# Patient Record
Sex: Male | Born: 1955 | Race: White | Hispanic: No | Marital: Married | Smoking: Never smoker
Health system: Southern US, Community
[De-identification: ages and names within clinical notes are randomized; demographics above are authoritative.]

## PROBLEM LIST (undated history)

## (undated) DIAGNOSIS — E785 Hyperlipidemia, unspecified: Secondary | ICD-10-CM

## (undated) DIAGNOSIS — I252 Old myocardial infarction: Secondary | ICD-10-CM

## (undated) HISTORY — PX: CORONARY ANGIOPLASTY WITH STENT PLACEMENT: SHX49

## (undated) HISTORY — DX: Hyperlipidemia, unspecified: E78.5

---

## 2003-09-03 ENCOUNTER — Ambulatory Visit (HOSPITAL_COMMUNITY): Admission: RE | Admit: 2003-09-03 | Discharge: 2003-09-03 | Payer: Self-pay | Admitting: Family Medicine

## 2004-11-09 ENCOUNTER — Emergency Department (HOSPITAL_COMMUNITY): Admission: EM | Admit: 2004-11-09 | Discharge: 2004-11-09 | Payer: Self-pay | Admitting: Emergency Medicine

## 2012-08-23 ENCOUNTER — Ambulatory Visit
Admission: RE | Admit: 2012-08-23 | Discharge: 2012-08-23 | Disposition: A | Discharge status: Home / Self Care | Payer: BC Managed Care – PPO | Source: Ambulatory Visit | Attending: Family Medicine | Admitting: Family Medicine

## 2012-08-23 ENCOUNTER — Other Ambulatory Visit: Payer: Self-pay | Admitting: Family Medicine

## 2012-08-23 DIAGNOSIS — R0602 Shortness of breath: Secondary | ICD-10-CM

## 2013-07-11 ENCOUNTER — Ambulatory Visit (INDEPENDENT_AMBULATORY_CARE_PROVIDER_SITE_OTHER): Payer: BC Managed Care – PPO | Admitting: Family Medicine

## 2013-07-11 ENCOUNTER — Encounter (INDEPENDENT_AMBULATORY_CARE_PROVIDER_SITE_OTHER): Payer: Self-pay

## 2013-07-11 ENCOUNTER — Encounter: Payer: Self-pay | Admitting: Family Medicine

## 2013-07-11 VITALS — BP 135/84 | HR 57 | Ht 71.0 in | Wt 190.0 lb

## 2013-07-11 DIAGNOSIS — M25569 Pain in unspecified knee: Secondary | ICD-10-CM

## 2013-07-11 DIAGNOSIS — M25561 Pain in right knee: Secondary | ICD-10-CM

## 2013-07-11 NOTE — Patient Instructions (Signed)
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 Start physical therapy for only 1-3 visits total to learn home exercise program and do this daily. Continue with mobic 15mg  daily with food for next 6 weeks. Icing as needed. Knee brace if this feels more supportive. Elevate above the level of your heart as needed for swelling. Follow up with me in 6 weeks for reevaluation. Consider injection, MRI if not improving as expected.

## 2013-07-15 ENCOUNTER — Encounter: Payer: Self-pay | Admitting: Family Medicine

## 2013-07-15 DIAGNOSIS — M25561 Pain in right knee: Secondary | ICD-10-CM | POA: Insufficient documentation

## 2013-07-15 NOTE — Progress Notes (Signed)
Patient ID: Jeremy Madden, male   DOB: August 20, 1955, 58 y.o.   MRN: [id number]  PCP: No primary provider on file.  Subjective:   HPI: Patient is a 58 y.o. male here for right knee pain.  Patient reports before thanksgiving he started getting pain in right knee. Works out about 5-6 days a week. No acute injury. Is also a PE teacher. Has tried aleve, icing, heat. No catching, locking, giving out. Seen MD at [facility] and scheduled for an MRI to assess for a meniscus tear. PCP gave him meloxicam instead of aleve.  Past Medical History  Diagnosis Date  . Hyperlipidemia     No current outpatient prescriptions on file prior to visit.   No current facility-administered medications on file prior to visit.    History reviewed. No pertinent past surgical history.  No Known Allergies  History   Social History  . Marital Status: Married    Spouse Name: N/A    Number of Children: N/A  . Years of Education: N/A   Occupational History  . Not on file.   Social History Main Topics  . Smoking status: Never Smoker   . Smokeless tobacco: Not on file  . Alcohol Use: Not on file  . Drug Use: Not on file  . Sexual Activity: Not on file   Other Topics Concern  . Not on file   Social History Narrative  . No narrative on file    Family History  Problem Relation Age of Onset  . Diabetes Mother   . Hypertension Mother   . Heart attack Father   . Sudden death Father     BP 135/84  Pulse 57  Ht 5\' 11"  (1.803 m)  Wt 190 lb (86.183 kg)  BMI 26.51 kg/m2  Review of Systems: See HPI above.    Objective:  Physical Exam:  Gen: NAD  Right knee: Mild effusion.  No gross deformity, ecchymoses.  TTP medial joint line. FROM. Negative ant/post drawers. Negative valgus/varus testing. Negative lachmanns. Positive mcmurrays, apleys, patellar apprehension. NV intact distally.    Assessment & Plan:  1. Right knee pain - radiographs reportedly negative for DJD - done at  [facility].  Advised he start with conservative treatment for degenerative medial meniscal tear.  PT, home exercises, mobic, icing, consider knee brace.  Consider injection, MRI if not improving.

## 2013-07-15 NOTE — Assessment & Plan Note (Signed)
radiographs reportedly negative for DJD - done at [facility]. Advised he start with conservative treatment for degenerative medial meniscal tear. PT, home exercises, mobic, icing, consider knee brace. Consider injection, MRI if not improving.

## 2013-08-03 ENCOUNTER — Ambulatory Visit (INDEPENDENT_AMBULATORY_CARE_PROVIDER_SITE_OTHER): Payer: BC Managed Care – PPO | Admitting: Physical Therapy

## 2013-08-03 DIAGNOSIS — M25669 Stiffness of unspecified knee, not elsewhere classified: Secondary | ICD-10-CM

## 2013-08-03 DIAGNOSIS — M25569 Pain in unspecified knee: Secondary | ICD-10-CM

## 2013-08-03 DIAGNOSIS — M6281 Muscle weakness (generalized): Secondary | ICD-10-CM

## 2013-08-03 DIAGNOSIS — R609 Edema, unspecified: Secondary | ICD-10-CM

## 2013-08-10 ENCOUNTER — Encounter (INDEPENDENT_AMBULATORY_CARE_PROVIDER_SITE_OTHER): Payer: BC Managed Care – PPO | Admitting: Physical Therapy

## 2013-08-10 DIAGNOSIS — M6281 Muscle weakness (generalized): Secondary | ICD-10-CM

## 2013-08-10 DIAGNOSIS — M25669 Stiffness of unspecified knee, not elsewhere classified: Secondary | ICD-10-CM

## 2013-08-10 DIAGNOSIS — R609 Edema, unspecified: Secondary | ICD-10-CM

## 2013-08-10 DIAGNOSIS — M25569 Pain in unspecified knee: Secondary | ICD-10-CM

## 2013-08-17 ENCOUNTER — Encounter: Payer: BC Managed Care – PPO | Admitting: Physical Therapy

## 2013-08-20 ENCOUNTER — Ambulatory Visit (INDEPENDENT_AMBULATORY_CARE_PROVIDER_SITE_OTHER): Payer: BC Managed Care – PPO | Admitting: Family Medicine

## 2013-08-20 ENCOUNTER — Encounter: Payer: Self-pay | Admitting: Family Medicine

## 2013-08-20 VITALS — BP 154/74 | HR 71 | Ht 71.0 in | Wt 190.0 lb

## 2013-08-20 DIAGNOSIS — M25561 Pain in right knee: Secondary | ICD-10-CM

## 2013-08-20 DIAGNOSIS — M25569 Pain in unspecified knee: Secondary | ICD-10-CM

## 2013-08-20 NOTE — Patient Instructions (Signed)
Stop the physical therapy. We will go ahead with an MRI. I typically will call you the afternoon following this to go over results and next steps.

## 2013-08-21 ENCOUNTER — Ambulatory Visit (INDEPENDENT_AMBULATORY_CARE_PROVIDER_SITE_OTHER): Payer: BC Managed Care – PPO

## 2013-08-21 ENCOUNTER — Encounter: Payer: Self-pay | Admitting: Family Medicine

## 2013-08-21 DIAGNOSIS — M25469 Effusion, unspecified knee: Secondary | ICD-10-CM

## 2013-08-21 DIAGNOSIS — X58XXXA Exposure to other specified factors, initial encounter: Secondary | ICD-10-CM

## 2013-08-21 DIAGNOSIS — M25561 Pain in right knee: Secondary | ICD-10-CM

## 2013-08-21 DIAGNOSIS — IMO0002 Reserved for concepts with insufficient information to code with codable children: Secondary | ICD-10-CM

## 2013-08-21 NOTE — Progress Notes (Addendum)
Patient ID: Jeremy Madden, male   DOB: 1956/03/27, 58 y.o.   MRN: [id number]  PCP: No primary provider on file.  Subjective:   HPI: Patient is a 58 y.o. male here for right knee pain.  1/28: Patient reports before thanksgiving he started getting pain in right knee. Works out about 5-6 days a week. No acute injury. Is also a PE teacher. Has tried aleve, icing, heat. No catching, locking, giving out. Seen MD at [facility] and scheduled for an MRI to assess for a meniscus tear. PCP gave him meloxicam instead of aleve.  3/9: Patient reports unfortunately his right knee feels worse. Has been doing physical therapy and home exercises. Taking mobic and using a brace. Had to be out of work Wednesday due to pain. Still swelling. Using heat, ice. No catching, locking, giving out.  Past Medical History  Diagnosis Date  . Hyperlipidemia     Current Outpatient Prescriptions on File Prior to Visit  Medication Sig Dispense Refill  . atorvastatin (LIPITOR) 80 MG tablet       . lisinopril (PRINIVIL,ZESTRIL) 2.5 MG tablet       . meloxicam (MOBIC) 15 MG tablet       . ZETIA 10 MG tablet        No current facility-administered medications on file prior to visit.    History reviewed. No pertinent past surgical history.  No Known Allergies  History   Social History  . Marital Status: Married    Spouse Name: N/A    Number of Children: N/A  . Years of Education: N/A   Occupational History  . Not on file.   Social History Main Topics  . Smoking status: Never Smoker   . Smokeless tobacco: Not on file  . Alcohol Use: Not on file  . Drug Use: Not on file  . Sexual Activity: Not on file   Other Topics Concern  . Not on file   Social History Narrative  . No narrative on file    Family History  Problem Relation Age of Onset  . Diabetes Mother   . Hypertension Mother   . Heart attack Father   . Sudden death Father     BP 154/74  Pulse 71  Ht 5\' 11"  (1.803 m)   Wt 190 lb (86.183 kg)  BMI 26.51 kg/m2  Review of Systems: See HPI above.    Objective:  Physical Exam:  Gen: NAD  Right knee: Mild effusion.  No gross deformity, ecchymoses.  TTP medial joint line. FROM. Negative ant/post drawers. Negative valgus/varus testing. Negative lachmanns. Positive mcmurrays, apleys.  Negative patellar apprehension. NV intact distally.    Assessment & Plan:  1. Right knee pain - radiographs reportedly negative for DJD - done at [facility].  Not improving with physical therapy, mobic.  Will stop PT and go ahead with MRI to assess for medial meniscal tear.  Consider injection, arthroscopy depending on those results.  Addendum:  MRI reviewed and discussed with patient.  He has a medial meniscal tear and moderate effusion.  In a lot of pain currently.  Will go ahead with referral back to ortho to discuss arthroscopic debridement.

## 2013-08-21 NOTE — Assessment & Plan Note (Signed)
radiographs reportedly negative for DJD - done at [facility] [place].  Not improving with physical therapy, mobic.  Will stop PT and go ahead with MRI to assess for medial meniscal tear.  Consider injection, arthroscopy depending on those results.

## 2013-08-22 ENCOUNTER — Ambulatory Visit: Payer: BC Managed Care – PPO | Admitting: Family Medicine

## 2019-01-29 ENCOUNTER — Emergency Department (HOSPITAL_BASED_OUTPATIENT_CLINIC_OR_DEPARTMENT_OTHER): Payer: BC Managed Care – PPO

## 2019-01-29 ENCOUNTER — Observation Stay (HOSPITAL_BASED_OUTPATIENT_CLINIC_OR_DEPARTMENT_OTHER)
Admission: EM | Admit: 2019-01-29 | Discharge: 2019-01-30 | Disposition: A | Discharge status: Home / Self Care | Payer: BC Managed Care – PPO | Attending: Internal Medicine | Admitting: Internal Medicine

## 2019-01-29 ENCOUNTER — Encounter (HOSPITAL_BASED_OUTPATIENT_CLINIC_OR_DEPARTMENT_OTHER): Payer: Self-pay | Admitting: *Deleted

## 2019-01-29 ENCOUNTER — Other Ambulatory Visit: Payer: Self-pay

## 2019-01-29 DIAGNOSIS — Z20828 Contact with and (suspected) exposure to other viral communicable diseases: Secondary | ICD-10-CM | POA: Diagnosis not present

## 2019-01-29 DIAGNOSIS — M1990 Unspecified osteoarthritis, unspecified site: Secondary | ICD-10-CM | POA: Diagnosis not present

## 2019-01-29 DIAGNOSIS — E785 Hyperlipidemia, unspecified: Secondary | ICD-10-CM | POA: Insufficient documentation

## 2019-01-29 DIAGNOSIS — R001 Bradycardia, unspecified: Secondary | ICD-10-CM | POA: Diagnosis not present

## 2019-01-29 DIAGNOSIS — I10 Essential (primary) hypertension: Secondary | ICD-10-CM | POA: Diagnosis not present

## 2019-01-29 DIAGNOSIS — Z955 Presence of coronary angioplasty implant and graft: Secondary | ICD-10-CM | POA: Diagnosis not present

## 2019-01-29 DIAGNOSIS — Z79899 Other long term (current) drug therapy: Secondary | ICD-10-CM | POA: Diagnosis not present

## 2019-01-29 DIAGNOSIS — I252 Old myocardial infarction: Secondary | ICD-10-CM | POA: Diagnosis not present

## 2019-01-29 DIAGNOSIS — R519 Headache, unspecified: Secondary | ICD-10-CM | POA: Diagnosis present

## 2019-01-29 DIAGNOSIS — R42 Dizziness and giddiness: Principal | ICD-10-CM | POA: Insufficient documentation

## 2019-01-29 DIAGNOSIS — R51 Headache: Secondary | ICD-10-CM | POA: Diagnosis not present

## 2019-01-29 HISTORY — DX: Old myocardial infarction: I25.2

## 2019-01-29 LAB — COMPREHENSIVE METABOLIC PANEL
ALT: 29 U/L (ref 0–44)
AST: 26 U/L (ref 15–41)
Albumin: 3.5 g/dL (ref 3.5–5.0)
Alkaline Phosphatase: 39 U/L (ref 38–126)
Anion gap: 9 (ref 5–15)
BUN: 15 mg/dL (ref 8–23)
CO2: 25 mmol/L (ref 22–32)
Calcium: 8.5 mg/dL — ABNORMAL LOW (ref 8.9–10.3)
Chloride: 106 mmol/L (ref 98–111)
Creatinine, Ser: 0.97 mg/dL (ref 0.61–1.24)
GFR calc Af Amer: >60 mL/min (ref >60)
GFR calc non Af Amer: >60 mL/min (ref >60)
Glucose, Bld: 120 mg/dL — ABNORMAL HIGH (ref 70–99)
Potassium: 3.6 mmol/L (ref 3.5–5.1)
Sodium: 140 mmol/L (ref 135–145)
Total Bilirubin: 0.7 mg/dL (ref 0.3–1.2)
Total Protein: 6.6 g/dL (ref 6.5–8.1)

## 2019-01-29 LAB — URINALYSIS, ROUTINE W REFLEX MICROSCOPIC
Bilirubin Urine: NEGATIVE
Glucose, UA: NEGATIVE mg/dL
Ketones, ur: NEGATIVE mg/dL
Leukocytes,Ua: NEGATIVE
Nitrite: NEGATIVE
Protein, ur: NEGATIVE mg/dL
Specific Gravity, Urine: >1.03 — ABNORMAL HIGH (ref 1.005–1.030)
pH: 6 (ref 5.0–8.0)

## 2019-01-29 LAB — URINALYSIS, MICROSCOPIC (REFLEX): Bacteria, UA: NONE SEEN

## 2019-01-29 LAB — CBC WITH DIFFERENTIAL/PLATELET
Abs Immature Granulocytes: 0.03 10*3/uL (ref 0.00–0.07)
Basophils Absolute: 0 10*3/uL (ref 0.0–0.1)
Basophils Relative: 0 %
Eosinophils Absolute: 0.1 10*3/uL (ref 0.0–0.5)
Eosinophils Relative: 2 %
HCT: 42.6 % (ref 39.0–52.0)
Hemoglobin: 14.3 g/dL (ref 13.0–17.0)
Immature Granulocytes: 1 %
Lymphocytes Relative: 21 %
Lymphs Abs: 1.2 10*3/uL (ref 0.7–4.0)
MCH: 29.4 pg (ref 26.0–34.0)
MCHC: 33.6 g/dL (ref 30.0–36.0)
MCV: 87.5 fL (ref 80.0–100.0)
Monocytes Absolute: 0.8 10*3/uL (ref 0.1–1.0)
Monocytes Relative: 14 %
Neutro Abs: 3.6 10*3/uL (ref 1.7–7.7)
Neutrophils Relative %: 62 %
Platelets: 187 10*3/uL (ref 150–400)
RBC: 4.87 MIL/uL (ref 4.22–5.81)
RDW: 11.9 % (ref 11.5–15.5)
WBC: 5.8 10*3/uL (ref 4.0–10.5)
nRBC: 0 % (ref 0.0–0.2)

## 2019-01-29 LAB — APTT: aPTT: 27 seconds (ref 24–36)

## 2019-01-29 LAB — RAPID URINE DRUG SCREEN, HOSP PERFORMED
Amphetamines: NOT DETECTED
Barbiturates: NOT DETECTED
Benzodiazepines: NOT DETECTED
Cocaine: NOT DETECTED
Opiates: NOT DETECTED
Tetrahydrocannabinol: NOT DETECTED

## 2019-01-29 LAB — PROTIME-INR
INR: 1 (ref 0.8–1.2)
Prothrombin Time: 13.2 seconds (ref 11.4–15.2)

## 2019-01-29 LAB — ETHANOL: Alcohol, Ethyl (B): <10 mg/dL (ref <10)

## 2019-01-29 MED ORDER — IOHEXOL 350 MG/ML SOLN
100.0000 mL | Freq: Once | INTRAVENOUS | Status: AC | PRN
  Administered 2019-01-29: 100 mL via INTRAVENOUS

## 2019-01-29 NOTE — ED Notes (Signed)
Patient transported to CT 

## 2019-01-29 NOTE — ED Triage Notes (Addendum)
Pt c/o sudden h/a lasting 2 mins at 67, c/o dizziness since , pt states " my eyes are disoriented"

## 2019-01-29 NOTE — ED Provider Notes (Signed)
[facility] EMERGENCY DEPARTMENT Provider Note   CSN: [id number] Arrival date & time: 01/29/19  1946     History   Chief Complaint Chief Complaint  Patient presents with   Dizziness    HPI Jeremy Madden is a 63 y.o. male.     The history is provided by the patient and medical records. No language interpreter was used.  Neurologic Problem This is a new problem. The current episode started 6 to 12 hours ago. The problem has been resolved. Associated symptoms include headaches. Pertinent negatives include no chest pain, no abdominal pain and no shortness of breath. Nothing aggravates the symptoms. Nothing relieves the symptoms. He has tried nothing for the symptoms. The treatment provided no relief.    Past Medical History:  Diagnosis Date   Hyperlipidemia    MI, old     Patient Active Problem List   Diagnosis Date Noted   Right knee pain 07/15/2013    History reviewed. No pertinent surgical history.      Home Medications    Prior to Admission medications   Medication Sig Start Date End Date Taking? Authorizing Provider  atorvastatin (LIPITOR) 80 MG tablet  05/15/13   [provider]  lisinopril (PRINIVIL,ZESTRIL) 2.5 MG tablet  06/19/13   [provider]  meloxicam (MOBIC) 15 MG tablet  07/03/13   [provider]  ZETIA 10 MG tablet  06/20/13   [provider]    Family History Family History  Problem Relation Age of Onset   Diabetes Mother    Hypertension Mother    Heart attack Father    Sudden death Father     Social History Social History   Tobacco Use   Smoking status: Never Smoker   Smokeless tobacco: Never Used  Substance Use Topics   Alcohol use: Not Currently   Drug use: Not on file     Allergies   Patient has no known allergies.   Review of Systems Review of Systems  Constitutional: Negative for chills, diaphoresis, fatigue and fever.  HENT: Negative for congestion.   Eyes:  Positive for visual disturbance. Negative for photophobia.  Respiratory: Negative for cough, chest tightness and shortness of breath.   Cardiovascular: Negative for chest pain.  Gastrointestinal: Negative for abdominal pain, constipation, diarrhea, nausea, rectal pain and vomiting.  Genitourinary: Negative for dysuria.  Musculoskeletal: Negative for back pain, neck pain and neck stiffness.  Skin: Negative for rash and wound.  Neurological: Positive for dizziness and headaches. Negative for weakness and light-headedness.  Psychiatric/Behavioral: Negative for agitation and confusion.  All other systems reviewed and are negative.    Physical Exam Updated Vital Signs Pulse 65    Temp 97.8 F (36.6 C) (Oral)    Resp 18    Ht 6' (1.829 m)    Wt 88.5 kg    SpO2 100%    BMI 26.45 kg/m   Physical Exam Vitals signs and nursing note reviewed.  Constitutional:      General: He is not in acute distress.    Appearance: Normal appearance. He is well-developed. He is not ill-appearing, toxic-appearing or diaphoretic.  HENT:     Head: Normocephalic and atraumatic.     Nose: No congestion or rhinorrhea.     Mouth/Throat:     Mouth: Mucous membranes are moist.     Pharynx: No oropharyngeal exudate.  Eyes:     General: No visual field deficit.    Extraocular Movements: Extraocular movements intact.  Conjunctiva/sclera: Conjunctivae normal.     Pupils: Pupils are equal, round, and reactive to light.  Neck:     Musculoskeletal: Neck supple. No neck rigidity or muscular tenderness.  Cardiovascular:     Rate and Rhythm: Regular rhythm. Bradycardia present.     Heart sounds: No murmur.  Pulmonary:     Effort: Pulmonary effort is normal. No respiratory distress.     Breath sounds: Normal breath sounds.  Abdominal:     General: There is no distension.     Palpations: Abdomen is soft.     Tenderness: There is no abdominal tenderness.  Skin:    General: Skin is warm and dry.     Findings: No  erythema.  Neurological:     General: No focal deficit present.     Mental Status: He is alert and oriented to person, place, and time.     Cranial Nerves: No cranial nerve deficit, dysarthria or facial asymmetry.     Sensory: No sensory deficit.     Motor: No weakness, tremor, abnormal muscle tone or seizure activity.     Coordination: Coordination normal. Finger-Nose-Finger Test normal.     Comments: Possible skew deviation between the eyes on alternating covering uncovering.  This resolved after repeated testing.  Gait deferred initially due to the dizziness intermittently.  Psychiatric:        Mood and Affect: Mood normal.      ED Treatments / Results  Labs (all labs ordered are listed, but only abnormal results are displayed) Labs Reviewed  URINALYSIS, ROUTINE W REFLEX MICROSCOPIC - Abnormal; Notable for the following components:      Result Value   Specific Gravity, Urine >1.030 (*)    Hgb urine dipstick MODERATE (*)    All other components within normal limits  COMPREHENSIVE METABOLIC PANEL - Abnormal; Notable for the following components:   Glucose, Bld 120 (*)    Calcium 8.5 (*)    All other components within normal limits  SARS CORONAVIRUS 2  ETHANOL  PROTIME-INR  APTT  RAPID URINE DRUG SCREEN, HOSP PERFORMED  CBC WITH DIFFERENTIAL/PLATELET  URINALYSIS, MICROSCOPIC (REFLEX)  DIFFERENTIAL    EKG None  ED ECG REPORT   Date: 01/30/2019  Rate: 56  Rhythm: sinus bradycardia  QRS Axis: normal  Intervals: normal  ST/T Wave abnormalities: normal  Conduction Disutrbances:nonspecific intraventricular conduction delay  Narrative Interpretation:   Old EKG Reviewed: none available  I have personally reviewed the EKG tracing and agree with the computerized printout as noted.   Radiology Ct Angio Head W Or Wo Contrast  Result Date: 01/29/2019 CLINICAL DATA:  Initial evaluation for acute vertigo. EXAM: CT ANGIOGRAPHY HEAD AND NECK TECHNIQUE: Multidetector CT  imaging of the head and neck was performed using the standard protocol during bolus administration of intravenous contrast. Multiplanar CT image reconstructions and MIPs were obtained to evaluate the vascular anatomy. Carotid stenosis measurements (when applicable) are obtained utilizing NASCET criteria, using the distal internal carotid diameter as the denominator. CONTRAST:  133mL OMNIPAQUE IOHEXOL 350 MG/ML SOLN COMPARISON:  Prior head CT from earlier the same day. FINDINGS: CTA NECK FINDINGS Aortic arch: Visualized aortic arch of normal caliber with normal 3 vessel morphology. No hemodynamically significant stenosis about the origin of the great vessels. Visualized subclavian arteries widely patent. Right carotid system: Right common carotid artery widely patent from its origin to the bifurcation without stenosis. Mild scattered calcified plaque about the right bifurcation without hemodynamically significant stenosis. Right ICA widely patent distally to the  skull base without stenosis, dissection or occlusion. Left carotid system: Left CCA widely patent from its origin to the bifurcation without stenosis. Mild scattered calcified plaque about the left bifurcation without hemodynamically significant stenosis. Left ICA widely patent distally to the skull base without stenosis, dissection, or occlusion. Vertebral arteries: Both vertebral arteries arise from the subclavian arteries. Left vertebral artery dominant. Proximal vertebral arteries limited in assessment due to body habitus. Visualized vertebral arteries widely patent without stenosis, dissection, or occlusion. Skeleton: No acute osseous abnormality. No discrete lytic or blastic osseous lesions. Moderate degenerative spondylolysis noted at C5-6. Other neck: No other acute soft tissue abnormality within the neck. Upper chest: Visualized upper chest demonstrates no acute finding. Review of the MIP images confirms the above findings CTA HEAD FINDINGS Anterior  circulation: Petrous segments widely patent. Mild scattered atheromatous plaque within the cavernous/supraclinoid ICAs without hemodynamically significant stenosis. ICA termini well perfused. A1 segments widely patent. Normal anterior communicating artery complex. Anterior cerebral arteries widely patent to their distal aspects without stenosis. No M1 stenosis or occlusion. Normal MCA bifurcations. Distal MCA branches well perfused and symmetric. Posterior circulation: Vertebral arteries widely patent to the vertebrobasilar junction without stenosis. Posteroinferior cerebral arteries patent bilaterally. Basilar mildly diminutive but widely patent to its distal aspect. Superior cerebral arteries patent bilaterally. Right PCA supplied via the basilar. Fetal type origin of the left PCA. PCAs well perfused to their distal aspects without stenosis. Venous sinuses: Patent. Anatomic variants: Fetal type origin of the left PCA. No intracranial aneurysm or other vascular abnormality. Review of the MIP images confirms the above findings IMPRESSION: 1. Negative CTA of the head and neck. No large vessel occlusion, hemodynamically significant stenosis, or other acute vascular abnormality. 2. Mild atherosclerotic change about the carotid bifurcations and carotid siphons without significant stenosis. 3. Wide patency of the vertebrobasilar system. 4. Fetal type origin of the left PCA. Electronically Signed   By: Jeannine Boga M.D.   On: 01/29/2019 23:21   Ct Head Wo Contrast  Result Date: 01/29/2019 CLINICAL DATA:  Pt states he felt an explosion in his head 3 times today and now feels disoriented and pressure behind eyes EXAM: CT HEAD WITHOUT CONTRAST TECHNIQUE: Contiguous axial images were obtained from the base of the skull through the vertex without intravenous contrast. COMPARISON:  None. FINDINGS: Brain: No evidence of acute infarction, hemorrhage, hydrocephalus, extra-axial collection or mass lesion/mass effect.  Vascular: No hyperdense vessel or unexpected calcification. Skull: Normal. Negative for fracture or focal lesion. Sinuses/Orbits: No acute finding. Other: None. IMPRESSION: Negative exam. Electronically Signed   By: Nolon Nations M.D.   On: 01/29/2019 20:21   Ct Angio Neck W And/or Wo Contrast  Result Date: 01/29/2019 CLINICAL DATA:  Initial evaluation for acute vertigo. EXAM: CT ANGIOGRAPHY HEAD AND NECK TECHNIQUE: Multidetector CT imaging of the head and neck was performed using the standard protocol during bolus administration of intravenous contrast. Multiplanar CT image reconstructions and MIPs were obtained to evaluate the vascular anatomy. Carotid stenosis measurements (when applicable) are obtained utilizing NASCET criteria, using the distal internal carotid diameter as the denominator. CONTRAST:  152mL OMNIPAQUE IOHEXOL 350 MG/ML SOLN COMPARISON:  Prior head CT from earlier the same day. FINDINGS: CTA NECK FINDINGS Aortic arch: Visualized aortic arch of normal caliber with normal 3 vessel morphology. No hemodynamically significant stenosis about the origin of the great vessels. Visualized subclavian arteries widely patent. Right carotid system: Right common carotid artery widely patent from its origin to the bifurcation without stenosis. Mild scattered calcified  plaque about the right bifurcation without hemodynamically significant stenosis. Right ICA widely patent distally to the skull base without stenosis, dissection or occlusion. Left carotid system: Left CCA widely patent from its origin to the bifurcation without stenosis. Mild scattered calcified plaque about the left bifurcation without hemodynamically significant stenosis. Left ICA widely patent distally to the skull base without stenosis, dissection, or occlusion. Vertebral arteries: Both vertebral arteries arise from the subclavian arteries. Left vertebral artery dominant. Proximal vertebral arteries limited in assessment due to body  habitus. Visualized vertebral arteries widely patent without stenosis, dissection, or occlusion. Skeleton: No acute osseous abnormality. No discrete lytic or blastic osseous lesions. Moderate degenerative spondylolysis noted at C5-6. Other neck: No other acute soft tissue abnormality within the neck. Upper chest: Visualized upper chest demonstrates no acute finding. Review of the MIP images confirms the above findings CTA HEAD FINDINGS Anterior circulation: Petrous segments widely patent. Mild scattered atheromatous plaque within the cavernous/supraclinoid ICAs without hemodynamically significant stenosis. ICA termini well perfused. A1 segments widely patent. Normal anterior communicating artery complex. Anterior cerebral arteries widely patent to their distal aspects without stenosis. No M1 stenosis or occlusion. Normal MCA bifurcations. Distal MCA branches well perfused and symmetric. Posterior circulation: Vertebral arteries widely patent to the vertebrobasilar junction without stenosis. Posteroinferior cerebral arteries patent bilaterally. Basilar mildly diminutive but widely patent to its distal aspect. Superior cerebral arteries patent bilaterally. Right PCA supplied via the basilar. Fetal type origin of the left PCA. PCAs well perfused to their distal aspects without stenosis. Venous sinuses: Patent. Anatomic variants: Fetal type origin of the left PCA. No intracranial aneurysm or other vascular abnormality. Review of the MIP images confirms the above findings IMPRESSION: 1. Negative CTA of the head and neck. No large vessel occlusion, hemodynamically significant stenosis, or other acute vascular abnormality. 2. Mild atherosclerotic change about the carotid bifurcations and carotid siphons without significant stenosis. 3. Wide patency of the vertebrobasilar system. 4. Fetal type origin of the left PCA. Electronically Signed   By: Jeannine Boga M.D.   On: 01/29/2019 23:21    Procedures Procedures  (including critical care time)  Medications Ordered in ED Medications  iohexol (OMNIPAQUE) 350 MG/ML injection 100 mL (100 mLs Intravenous Contrast Given 01/29/19 2224)     Initial Impression / Assessment and Plan / ED Course  I have reviewed the triage vital signs and the nursing notes.  Pertinent labs & imaging results that were available during my care of the patient were reviewed by me and considered in my medical decision making (see chart for details).        Jeremy Madden is a 63 y.o. male with a past medical history significant for CAD with MI, hyperlipidemia and prior vertigo who presents with several episodes of dizziness and vision changes.  Patient reports that at noon today he had sudden onset of a sharp "explosion" headache that was moderate to severe and lasted several seconds.  He reports that after that he had several minutes of intense dizziness all over his head and he had some associated visual focusing difficulties.  He reports that this lasted about 20 to 30 minutes before resolving and then he continued his day.  He reports that 2 PM he had another episode without any headache but instead having the intense dizziness for several minutes and persistent vision changes with difficulty focusing.  He says that it also took a while to recover and then had a third episode this evening prompting him to seek evaluation.  He currently reports he is at his baseline from a neurologic standpoint and denies any current headaches.  He denies any blurry vision or double vision.  He denies numbness, tingling, weakness of extremities.  He denies any recent head injuries, fevers, chills, cough, congestion, or other symptoms.  On exam, patient had possibly skewed deviation of his eyes when alternating covering eyes there was a small jump in his deviation.  Patient had symmetric smile and clear speech.  No numbness, tingling, weakness extremities.  Normal finger-nose-finger testing.  Lungs  clear chest nontender.  Abdomen nontender.  Patient otherwise resting calmly with no evidence of trauma.  Patient had a screening CT scan in triage showing no significant abnormalities.  Due to the concerning story for possible TIA as this feels very different than his prior vertigo, spoke with Dr. Kathrynn Speed who recommended admission for TIA work-up to [facility].  They recommended CTA head and neck and then he will likely need MRI to further evaluate.  After CTs are completed, will transfer for admission to [facility] for likely TIA.  CTA showed no evidence of dissection or aneurysm.  Patient will be admitted to [facility] for neurology evaluation, MRI, and further TIA work-up.  Final Clinical Impressions(s) / ED Diagnoses   Final diagnoses:  Dizziness    ED Discharge Orders    None      Clinical Impression: 1. Dizziness     Disposition: Admit  This note was prepared with assistance of Dragon voice recognition software. Occasional wrong-word or sound-a-like substitutions may have occurred due to the inherent limitations of voice recognition software.     Thai Hemrick, Gwenyth Allegra, MD 01/30/19 [phone]

## 2019-01-30 ENCOUNTER — Observation Stay (HOSPITAL_COMMUNITY): Payer: BC Managed Care – PPO

## 2019-01-30 ENCOUNTER — Observation Stay (HOSPITAL_BASED_OUTPATIENT_CLINIC_OR_DEPARTMENT_OTHER): Payer: BC Managed Care – PPO

## 2019-01-30 ENCOUNTER — Encounter (HOSPITAL_COMMUNITY): Payer: Self-pay | Admitting: Internal Medicine

## 2019-01-30 DIAGNOSIS — M1990 Unspecified osteoarthritis, unspecified site: Secondary | ICD-10-CM | POA: Diagnosis not present

## 2019-01-30 DIAGNOSIS — I10 Essential (primary) hypertension: Secondary | ICD-10-CM | POA: Diagnosis not present

## 2019-01-30 DIAGNOSIS — E785 Hyperlipidemia, unspecified: Secondary | ICD-10-CM | POA: Diagnosis not present

## 2019-01-30 DIAGNOSIS — I340 Nonrheumatic mitral (valve) insufficiency: Secondary | ICD-10-CM | POA: Diagnosis not present

## 2019-01-30 DIAGNOSIS — R51 Headache: Secondary | ICD-10-CM

## 2019-01-30 DIAGNOSIS — G459 Transient cerebral ischemic attack, unspecified: Secondary | ICD-10-CM | POA: Diagnosis not present

## 2019-01-30 DIAGNOSIS — R001 Bradycardia, unspecified: Secondary | ICD-10-CM

## 2019-01-30 LAB — SEDIMENTATION RATE: Sed Rate: 0 mm/hr (ref 0–16)

## 2019-01-30 LAB — ECHOCARDIOGRAM COMPLETE
Height: 72 in
Weight: 3120 oz

## 2019-01-30 LAB — C-REACTIVE PROTEIN: CRP: <0.8 mg/dL (ref <1.0)

## 2019-01-30 LAB — SARS CORONAVIRUS 2 (TAT 6-24 HRS): SARS Coronavirus 2: NEGATIVE

## 2019-01-30 MED ORDER — ASPIRIN 325 MG PO TABS
325.0000 mg | ORAL_TABLET | Freq: Every day | ORAL | Status: DC
  Administered 2019-01-30: 325 mg via ORAL
  Filled 2019-01-30: qty 1

## 2019-01-30 MED ORDER — ASPIRIN 300 MG RE SUPP: 300.0000 mg | Freq: Every day | RECTAL | Status: DC

## 2019-01-30 MED ORDER — EZETIMIBE 10 MG PO TABS
10.0000 mg | ORAL_TABLET | Freq: Every day | ORAL | Status: DC
  Administered 2019-01-30: 10 mg via ORAL
  Filled 2019-01-30: qty 1

## 2019-01-30 MED ORDER — ENOXAPARIN SODIUM 40 MG/0.4ML ~~LOC~~ SOLN
40.0000 mg | SUBCUTANEOUS | Status: DC
  Administered 2019-01-30: 40 mg via SUBCUTANEOUS
  Filled 2019-01-30: qty 0.4

## 2019-01-30 MED ORDER — SODIUM CHLORIDE 0.9 % IV SOLN
Freq: Once | INTRAVENOUS | Status: AC
  Administered 2019-01-30: 12:00:00 via INTRAVENOUS

## 2019-01-30 MED ORDER — CALCIUM GLUCONATE-NACL 1-0.675 GM/50ML-% IV SOLN
1.0000 g | Freq: Once | INTRAVENOUS | Status: AC
  Administered 2019-01-30: 1000 mg via INTRAVENOUS
  Filled 2019-01-30: qty 50

## 2019-01-30 MED ORDER — ACETAMINOPHEN 325 MG PO TABS: 650.0000 mg | ORAL_TABLET | ORAL | Status: DC | PRN

## 2019-01-30 MED ORDER — SENNOSIDES-DOCUSATE SODIUM 8.6-50 MG PO TABS: 1.0000 | ORAL_TABLET | Freq: Every evening | ORAL | Status: DC | PRN

## 2019-01-30 MED ORDER — STROKE: EARLY STAGES OF RECOVERY BOOK
Freq: Once | Status: AC
  Administered 2019-01-30: 12:00:00
  Filled 2019-01-30: qty 1

## 2019-01-30 MED ORDER — ATORVASTATIN CALCIUM 80 MG PO TABS: 80.0000 mg | ORAL_TABLET | Freq: Every day | ORAL | Status: DC

## 2019-01-30 MED ORDER — LISINOPRIL 2.5 MG PO TABS: 2.5000 mg | ORAL_TABLET | Freq: Every day | ORAL | Status: DC

## 2019-01-30 MED ORDER — ACETAMINOPHEN 160 MG/5ML PO SOLN: 650.0000 mg | ORAL | Status: DC | PRN

## 2019-01-30 MED ORDER — ACETAMINOPHEN 650 MG RE SUPP: 650.0000 mg | RECTAL | Status: DC | PRN

## 2019-01-30 NOTE — Progress Notes (Signed)
Pt was discharged waiting for his ride, pt's wife called from the main entrance and pt was taken down on a wheelchair at [facility], discharge instructions and package was given to pt by the day shift RN, Pt reassured. Obasogie-Asidi, Alise Calais Efe

## 2019-01-30 NOTE — Consult Note (Addendum)
Neurology Consultation  Reason for Consult: Headache  Referring Physician: Dr. Tamala Julian  CC: Burning behind eyes, odd sensation in head, sudden pain intracranially   History is obtained from: Patient  HPI: Jeremy Madden is a 63 y.o. male with past medical history of myocardial infarct and hyperlipidemia.  Patient states that he has never had headaches and no history of migraine headaches.  Yesterday at 12 PM patient was urinating and suddenly felt a sharp pain in the center of his forehead followed by a odd sensation intracranially.  He has a hard time describing the sensation.  The only way he could describe it was, "everything was out of whack, and I was having a hard time comprehending in keeping my thoughts straight and ".  This only lasted for approximately 2 minutes.  At that time his wife noted that he was taking longer than usual and came to check on him.  He denies any numbness, tingling, diplopia, vertigo, weakness, and or syncope.  By 1:00 he felt 99% better and went to work.  At approximately 1400 hrs. he was driving on the road and felt the odd sensation intracranially again.  This time he did not have any sensation of pain.  He did feel as though he was in a pass out and pulled to the side of the road.  Again symptoms resolved over a short period of time and he continued to work.  That night he went out to dinner, he states he looked up at the TV and once again had the odd sensation in his head.  Again he denies any diplopia, pain, numbness, weakness.  At that time he decided to come to the emergency department.  The 1 thing that is constant throughout the day was a burning sensation behind his eyes.  He notes that when he turns his head left and right the retro-orbital burning sensation is worse.  He also notes that he has some pressure behind his eyes.  He states his vision is completely clear.    ED course vitals, CT scan of brain without contrast.  CTA of head and neck.  CMP,  urinalysis.  Urine drug screen.   Work up that has been done: As above   LKW: 12 PM on 01/29/2019 tpa given?: no, out of window and NIH stroke scale of 0 Premorbid modified Rankin scale (mRS): 0 NIH stroke score : 0   ROS: A 14 point ROS was performed and is negative except as noted in the HPI.   Past Medical History:  Diagnosis Date  . Hyperlipidemia   . MI, old     Family History  Problem Relation Age of Onset  . Diabetes Mother   . Hypertension Mother   . Heart attack Father   . Sudden death Father     Social History:   reports that he has never smoked. He has never used smokeless tobacco. He reports previous alcohol use. No history on file for drug.  Medications  Current Facility-Administered Medications:  .   stroke: mapping our early stages of recovery book, , Does not apply, Once, Smith, Rondell A, MD .  0.9 %  sodium chloride infusion, , Intravenous, Once, Smith, Rondell A, MD .  acetaminophen (TYLENOL) tablet 650 mg, 650 mg, Oral, Q4H PRN **OR** acetaminophen (TYLENOL) solution 650 mg, 650 mg, Per Tube, Q4H PRN **OR** acetaminophen (TYLENOL) suppository 650 mg, 650 mg, Rectal, Q4H PRN, Smith, Rondell A, MD .  aspirin suppository 300 mg, 300 mg, Rectal,  Daily **OR** aspirin tablet 325 mg, 325 mg, Oral, Daily, Smith, Rondell A, MD .  atorvastatin (LIPITOR) tablet 80 mg, 80 mg, Oral, q1800, Smith, Rondell A, MD .  calcium gluconate 1 g/ 50 mL sodium chloride IVPB, 1 g, Intravenous, Once, Smith, Rondell A, MD .  enoxaparin (LOVENOX) injection 40 mg, 40 mg, Subcutaneous, Q24H, Smith, Rondell A, MD .  ezetimibe (ZETIA) tablet 10 mg, 10 mg, Oral, Daily, Tamala Julian, Rondell A, MD .  Derrill Memo ON 01/31/2019] lisinopril (ZESTRIL) tablet 2.5 mg, 2.5 mg, Oral, Daily, Smith, Rondell A, MD .  senna-docusate (Senokot-S) tablet 1 tablet, 1 tablet, Oral, QHS PRN, Norval Morton, MD   Exam: Current vital signs: BP (!) 151/95 (BP Location: Right Arm)   Pulse (!) 53   Temp 98.6 F (37  C) (Oral)   Resp 16   Ht 6' (1.829 m)   Wt 88.5 kg   SpO2 98%   BMI 26.45 kg/m  Vital signs in last 24 hours: Temp:  [97.8 F (36.6 C)-98.6 F (37 C)] 98.6 F (37 C) (08/18 0849) Pulse Rate:  [47-65] 53 (08/18 0849) Resp:  [12-24] 16 (08/18 0849) BP: (114-166)/(62-95) 151/95 (08/18 0849) SpO2:  [95 %-100 %] 98 % (08/18 0849) Weight:  [88.5 kg] 88.5 kg (08/17 1952)  Physical Exam  Constitutional: Appears well-developed and well-nourished.  Psych: Affect appropriate to situation Eyes: No scleral injection HENT: No OP obstrucion Head: Normocephalic.  Cardiovascular: Normal rate and regular rhythm.  Respiratory: Effort normal, non-labored breathing GI: Soft.  No distension. There is no tenderness.  Skin: WDI  Neuro: Mental Status: Patient is awake, alert, oriented to person, place, month, year, and situation. Patient is able to give a clear and coherent history. No signs of aphasia or neglect Cranial Nerves: II: Visual Fields are full.  III,IV, VI: EOMI without ptosis or diploplia. Pupils equal, round and reactive to light V: Facial sensation is symmetric to temperature VII: Facial movement is symmetric.  VIII: hearing is intact to voice X: Palat elevates symmetrically XI: Shoulder shrug is symmetric. XII: tongue is midline without atrophy or fasciculations.  Motor: Tone is normal. Bulk is normal. 5/5 strength was present in all four extremities.  Sensory: Sensation is symmetric to light touch and temperature in the arms and legs. Deep Tendon Reflexes: 2+ and symmetric in the biceps and patellae.  Plantars: Toes are downgoing bilaterally.  Cerebellar: FNF and HKS are intact bilaterally  Labs I have reviewed labs in epic and the results pertinent to this consultation are:   CBC    Component Value Date/Time   WBC 5.8 01/29/2019 2145   RBC 4.87 01/29/2019 2145   HGB 14.3 01/29/2019 2145   HCT 42.6 01/29/2019 2145   PLT 187 01/29/2019 2145   MCV 87.5  01/29/2019 2145   MCH 29.4 01/29/2019 2145   MCHC 33.6 01/29/2019 2145   RDW 11.9 01/29/2019 2145   LYMPHSABS 1.2 01/29/2019 2145   MONOABS 0.8 01/29/2019 2145   EOSABS 0.1 01/29/2019 2145   BASOSABS 0.0 01/29/2019 2145    CMP     Component Value Date/Time   NA 140 01/29/2019 2145   K 3.6 01/29/2019 2145   CL 106 01/29/2019 2145   CO2 25 01/29/2019 2145   GLUCOSE 120 (H) 01/29/2019 2145   BUN 15 01/29/2019 2145   CREATININE 0.97 01/29/2019 2145   CALCIUM 8.5 (L) 01/29/2019 2145   PROT 6.6 01/29/2019 2145   ALBUMIN 3.5 01/29/2019 2145   AST 26 01/29/2019 2145  ALT 29 01/29/2019 2145   ALKPHOS 39 01/29/2019 2145   BILITOT 0.7 01/29/2019 2145   GFRNONAA >60 01/29/2019 2145   GFRAA >60 01/29/2019 2145    Lipid Panel  No results found for: CHOL, TRIG, HDL, CHOLHDL, VLDL, LDLCALC, LDLDIRECT   Imaging I have reviewed the images obtained:  CT-scan of the brain- negative exam  CTA of head and neck- negative CTA of head and neck.  No large vessel occlusion or hemodynamically significant stenosis.  Wide patency of the vertebrobasilar system.  MRI examination of the brain- negative for acute findings  Etta Quill PA-C [facility] [phone]  M-F  (9:00 am- 5:00 PM)  01/30/2019, 10:34 AM    ASSESSMENT AND PLAN  63 year old male with past medical history significant for hyperlipidemia and prior myocardial infarction presents to [place] after developing a sudden intense sharp stabbing pain in his head yesterday afternoon that lasted approximately 2 minutes followed by "abnormal sensation inside his head was moving.  He describes it as a 8 out of 10 pain, states it is on the surface of his head and does not like to refer to it as a headache.  Patient denies double vision, photophobia, nausea or vomiting or gait imbalance after the event.  He did state that he felt sweaty after the episode.  Symptoms gradually eased up over 20 minutes and patient felt  almost back to his baseline apart from burning sensation behind both eyes.  Around 2 PM while patient was driving he had another episode where he felt similar to his previous vertigo.  He did feel like he was going to pass out and stopped his car on the curbside of the road for a short while.  Symptoms resolved after a few minutes and patient continued his routine.  He went to dinner outside and while looking up at the TV he had another episode of odd sensation lasting for about 2 minutes.  He decided to present to the emergency room.  Stat CT head was negative for any acute findings.  EDP at outside hospital felt patient man had skew deviation.  Patient underwent CT angiogram was also negative for any aneurysm.  Patient was transferred to [facility] to obtain MRI brain to rule out stroke.  MRI was negative for any acute findings.  Currently his only symptom is mild burning behind both eyes.  Symptoms does not appear to be consistent with TIA.  While description of headache does not appear to be typical for thunderclap headache-his work-up included a stat CT within 24 hours of symptom onset and 6 hours from third episode.  CT angiogram was negative for any dissection or aneurysm.  MRI brain is also nonrevealing for any acute findings.  Echocardiogram also was performed which showed EF of 55 to 60%, no PFO or thrombus.  While this could be a very rare presentation of MRI negative brainstem infarct, patient already on aspirin and statin for coronary artery disease.  Work-up for life-threatening causes for thunderclap headache negative.  Patient no longer complaining of significant symptoms.  On examination, patient does not have any nystagmus.  Test of skew negative.  Cranial nerve exam is normal and motor examination as well sensory examination is normal.     

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
## 2019-01-30 NOTE — Progress Notes (Signed)
Patient arrived to [id number]. A&O x4. POC provided to patient. Denies pain. Complains of burning in eyes. Nurse will continue to monitor.
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
## 2019-01-30 NOTE — TOC Initial Note (Addendum)
Transition of Care [facility]) - Initial/Assessment Note    Patient Details  Name: Jeremy Madden MRN: [id number] Date of Birth: [birth year]  [facility] CM/SW Contact:    Pollie Friar, RN Phone Number: 01/30/2019, 1:25 PM  Clinical Narrative:   PCP: Dr Kenton Kingfisher with [facility]               Pt denies any issues with his home medications. Pt denies issues with transportation.  TOC following for d/c needs.   Expected Discharge Plan: Home/Self Care Barriers to Discharge: Continued Medical Work up   Patient Goals and CMS Choice        Expected Discharge Plan and Services Expected Discharge Plan: Home/Self Care   Discharge Planning Services: CM Consult   Living arrangements for the past 2 months: Single Family Home(2 levels with bedrooms upstairs)                                      Prior Living Arrangements/Services Living arrangements for the past 2 months: Single Family Home(2 levels with bedrooms upstairs) Lives with:: Spouse Patient language and need for interpreter reviewed:: Yes(no needs) Do you feel safe going back to the place where you live?: Yes        Care giver support system in place?: Yes (comment)(wife is able to provide 24 hour supervision (working from home))   Criminal Activity/Legal Involvement Pertinent to Current Situation/Hospitalization: No - Comment as needed  Activities of Daily Living      Permission Sought/Granted                  Emotional Assessment   Attitude/Demeanor/Rapport: Engaged Affect (typically observed): Accepting, Pleasant Orientation: : Oriented to Self, Oriented to Place, Oriented to  Time, Oriented to Situation   Psych Involvement: No (comment)  Admission diagnosis:  Dizziness [R42] Patient Active Problem List   Diagnosis Date Noted  . Bradycardia 01/30/2019  . Essential hypertension 01/30/2019  . Hyperlipidemia 01/30/2019  . Arthritis 01/30/2019  . TIA (transient ischemic attack)  01/29/2019  . Right knee pain 07/15/2013   PCP:  System, Pcp Not In Pharmacy:   [facility], [place] [address] Phone: [phone] Fax: [phone]     Social Determinants of Health (SDOH) Interventions    Readmission Risk Interventions No flowsheet data found.

## 2020-03-25 DIAGNOSIS — I2510 Atherosclerotic heart disease of native coronary artery without angina pectoris: Secondary | ICD-10-CM | POA: Diagnosis not present

## 2020-03-25 DIAGNOSIS — E7800 Pure hypercholesterolemia, unspecified: Secondary | ICD-10-CM | POA: Diagnosis not present

## 2020-03-25 DIAGNOSIS — Z125 Encounter for screening for malignant neoplasm of prostate: Secondary | ICD-10-CM | POA: Diagnosis not present

## 2020-03-25 DIAGNOSIS — Z1159 Encounter for screening for other viral diseases: Secondary | ICD-10-CM | POA: Diagnosis not present

## 2020-03-25 DIAGNOSIS — Z0000 Encounter for general adult medical examination without abnormal findings: Secondary | ICD-10-CM | POA: Diagnosis not present

## 2020-10-27 IMAGING — MR MRI HEAD WITHOUT CONTRAST
12 of 13 series · 44 of 48 positions shown · non-contrast
Comparison: CT angiogram head/neck 01/29/2019, head CT 01/29/2019

CLINICAL DATA: Vertigo, persistent, central transient dizziness
episode with vision abnormalities. Possible TIA.

EXAM:
MRI HEAD WITHOUT CONTRAST
TECHNIQUE: Multiplanar, multiecho pulse sequences of the brain and surrounding
structures were obtained without intravenous contrast.

[Series 5: DWI · axial · 3.0mm · 0.88mm/px · z∈[-69,+78]mm · 8 of 100 slices shown (1 of 4)]
[im 1/100]
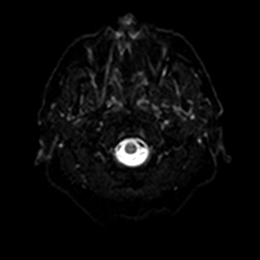
[im 15/100]
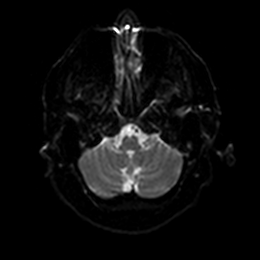
[im 29/100]
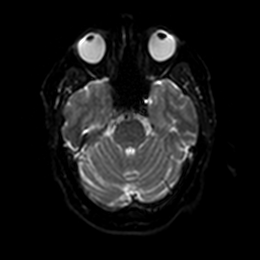
[im 43/100]
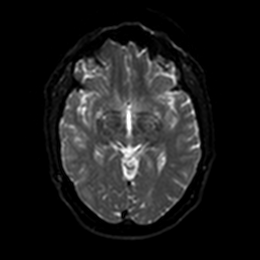
[im 57/100]
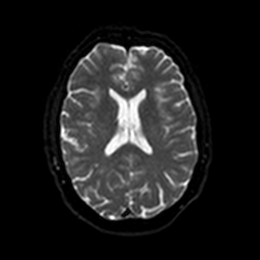
[im 71/100]
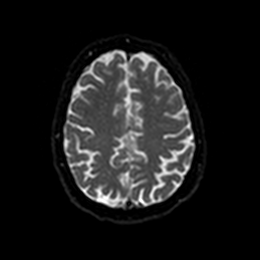
[im 85/100]
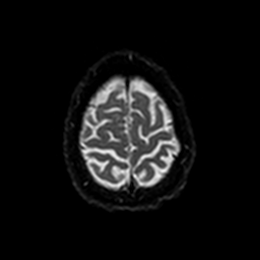
[im 100/100]
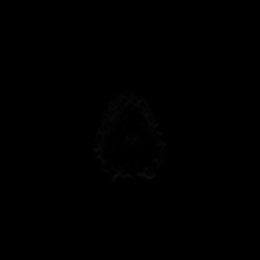

[Series 6: DWI · axial · 3.0mm · 0.88mm/px · z∈[-69,+78]mm · 4 of 50 slices shown (2 of 4)]
[im 1/50]
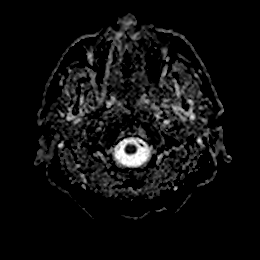
[im 17/50]
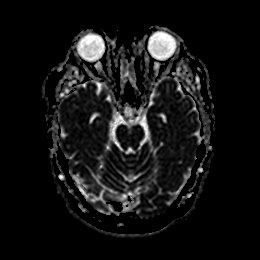
[im 33/50]
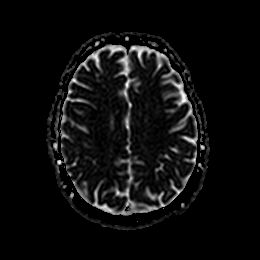
[im 50/50]
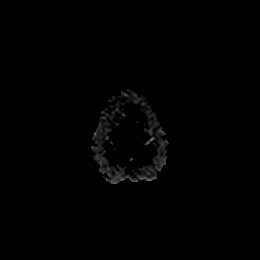

[Series 7: DWI · coronal · 4.0mm · 0.88mm/px · 5 of 72 slices shown (3 of 4)]
[im 1/72]
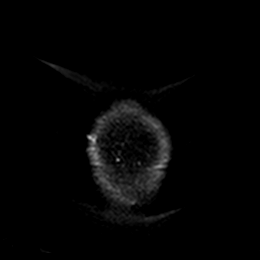
[im 18/72]
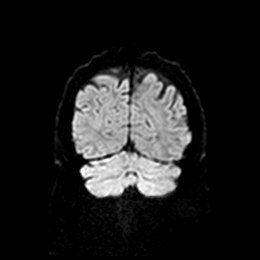
[im 36/72]
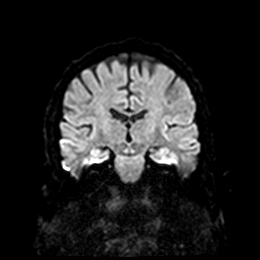
[im 54/72]
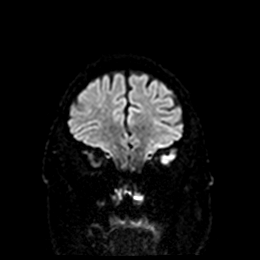
[im 72/72]
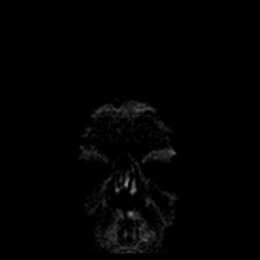

[Series 8: DWI · coronal · 4.0mm · 0.88mm/px · 3 of 36 slices shown (4 of 4)]
[im 1/36]
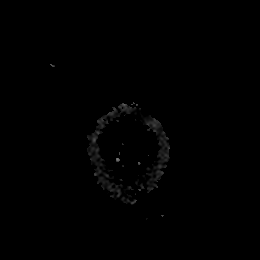
[im 18/36]
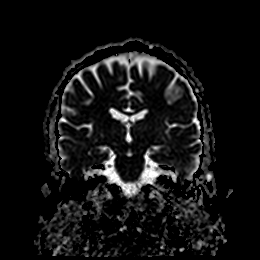
[im 36/36]
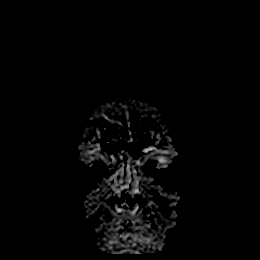

[Series 9: T1 · sagittal · 5.0mm · 0.75mm/px · 2 of 23 slices shown]
[im 1/23]
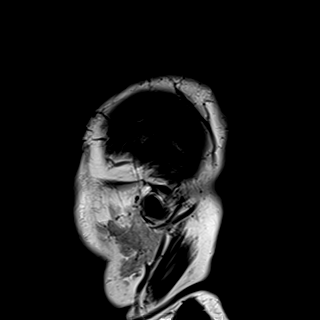
[im 23/23]
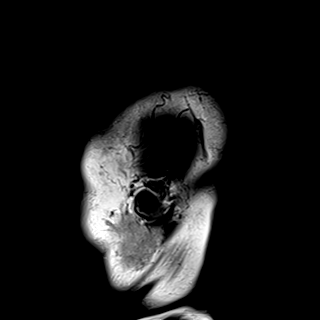

[Series 10: T2 · axial · 5.0mm · 0.72mm/px · z∈[-68,+76]mm · 2 of 25 slices shown (1 of 2)]
[im 1/25]
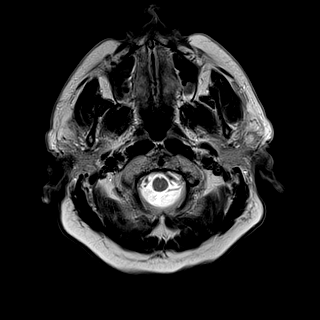
[im 25/25]
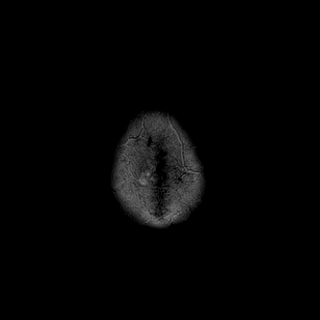

[Series 11: FLAIR · axial · 5.0mm · 0.45mm/px · z∈[-67,+77]mm · 2 of 25 slices shown]
[im 1/25]
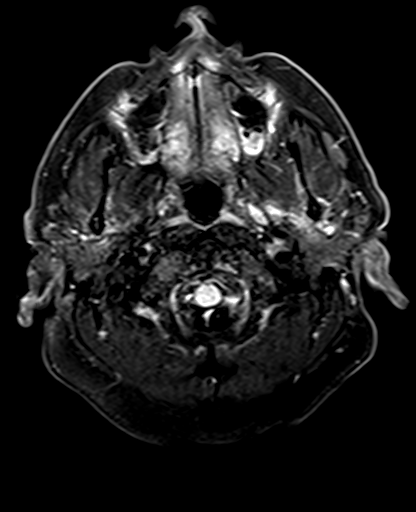
[im 25/25]
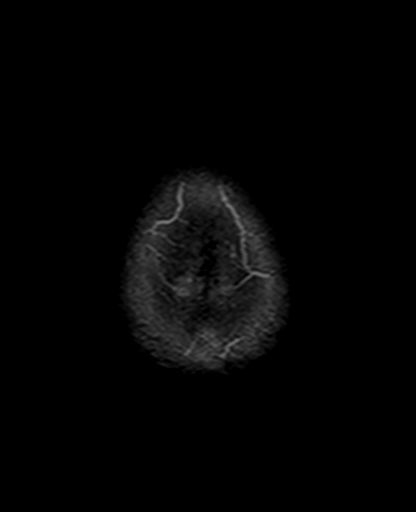

[Series 12: mag_images · axial · 3.0mm · 0.90mm/px · z∈[-84,+93]mm · 4 of 60 slices shown]
[im 1/60]
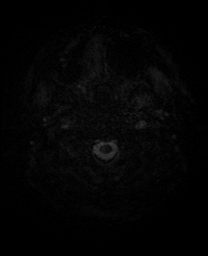
[im 20/60]
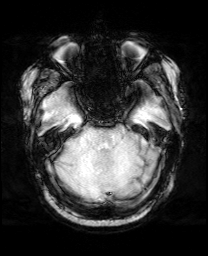
[im 40/60]
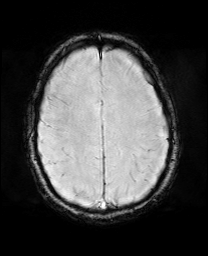
[im 60/60]
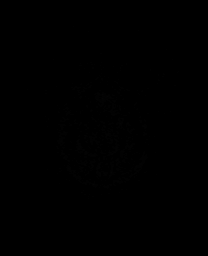

[Series 13: pha_images · axial · 3.0mm · 0.90mm/px · z∈[-84,+93]mm · 4 of 59 slices shown]
[im 1/59]
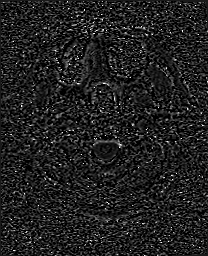
[im 20/59]
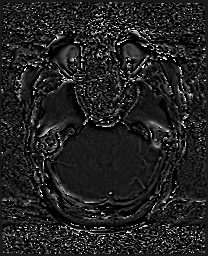
[im 39/59]
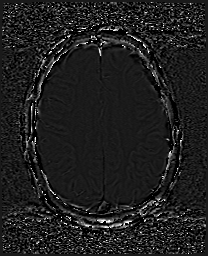
[im 59/59]
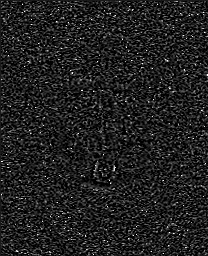

[Series 14: swi_images · axial · 3.0mm · 0.90mm/px · z∈[-84,+93]mm · 4 of 60 slices shown]
[im 1/60]
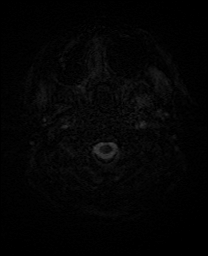
[im 20/60]
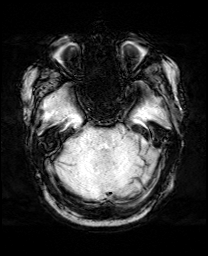
[im 40/60]
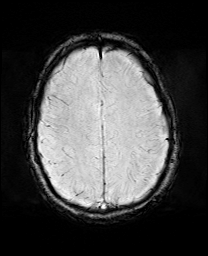
[im 60/60]
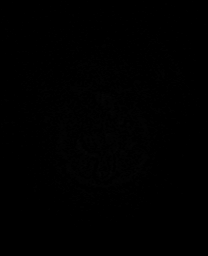

[Series 15: mip_images(sw) · axial · 24.0mm · 0.90mm/px · z∈[-73,+83]mm · 4 of 53 slices shown]
[im 1/53]
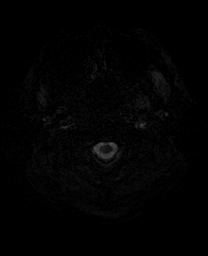
[im 18/53]
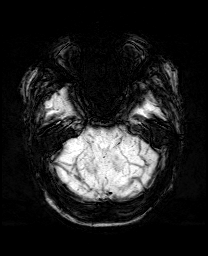
[im 35/53]
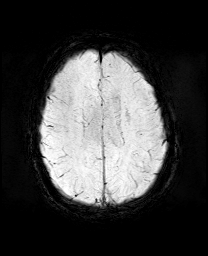
[im 53/53]
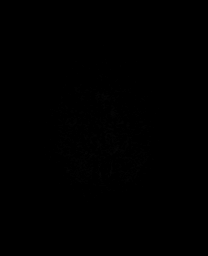

[Series 17: T2 · coronal · 5.0mm · 0.34mm/px · 2 of 29 slices shown (2 of 2)]
[im 1/29]
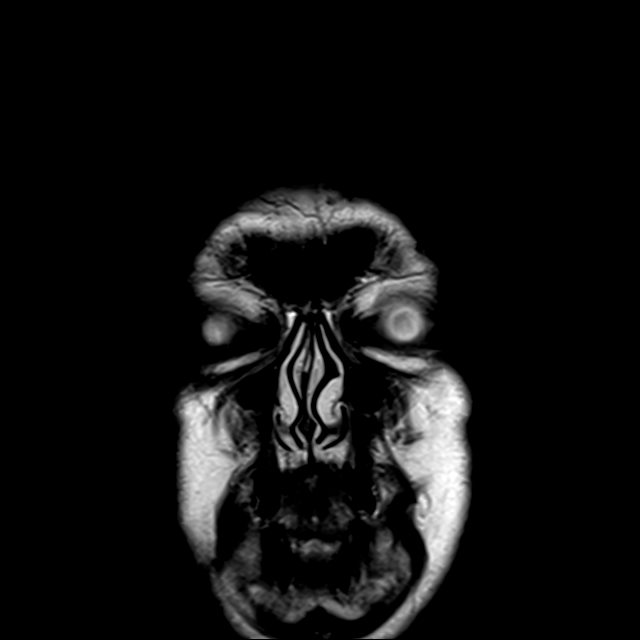
[im 29/29]
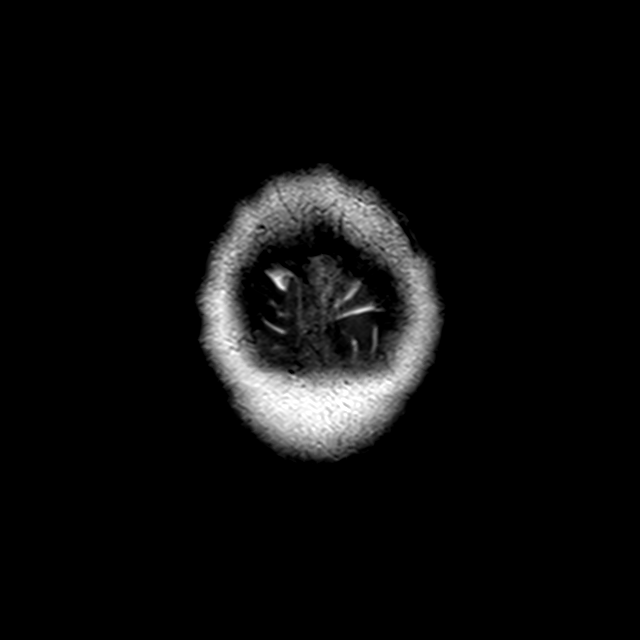

[44 of 48 positions shown; findings below may reference images not displayed]

FINDINGS: Brain: No evidence of acute infarct. No evidence of intracranial
mass. No chronic intracranial hemorrhage. No midline shift or
extra-axial collection. No focal parenchymal signal abnormality.
Cerebral volume is age appropriate.

Vascular: Flow voids maintained within the proximal large vessels.

Skull and upper cervical spine: Normal marrow signal.

Sinuses/Orbits: The imaged globes and orbits demonstrate no acute
abnormality. Trace ethmoid sinus mucosal thickening. No significant
mastoid effusion.
IMPRESSION: Unremarkable non-contrast brain MRI. No evidence of acute
intracranial abnormality.

## 2020-11-12 DIAGNOSIS — L814 Other melanin hyperpigmentation: Secondary | ICD-10-CM | POA: Diagnosis not present

## 2020-11-12 DIAGNOSIS — L820 Inflamed seborrheic keratosis: Secondary | ICD-10-CM | POA: Diagnosis not present

## 2020-11-12 DIAGNOSIS — L821 Other seborrheic keratosis: Secondary | ICD-10-CM | POA: Diagnosis not present

## 2020-11-12 DIAGNOSIS — D225 Melanocytic nevi of trunk: Secondary | ICD-10-CM | POA: Diagnosis not present

## 2020-11-12 DIAGNOSIS — L578 Other skin changes due to chronic exposure to nonionizing radiation: Secondary | ICD-10-CM | POA: Diagnosis not present

## 2020-11-12 DIAGNOSIS — L570 Actinic keratosis: Secondary | ICD-10-CM | POA: Diagnosis not present

## 2021-03-19 DIAGNOSIS — E785 Hyperlipidemia, unspecified: Secondary | ICD-10-CM | POA: Diagnosis not present

## 2021-03-19 DIAGNOSIS — I25118 Atherosclerotic heart disease of native coronary artery with other forms of angina pectoris: Secondary | ICD-10-CM | POA: Diagnosis not present

## 2021-03-19 DIAGNOSIS — I252 Old myocardial infarction: Secondary | ICD-10-CM | POA: Diagnosis not present

## 2021-03-27 DIAGNOSIS — E7800 Pure hypercholesterolemia, unspecified: Secondary | ICD-10-CM | POA: Diagnosis not present

## 2021-03-27 DIAGNOSIS — I2510 Atherosclerotic heart disease of native coronary artery without angina pectoris: Secondary | ICD-10-CM | POA: Diagnosis not present

## 2021-03-27 DIAGNOSIS — M79672 Pain in left foot: Secondary | ICD-10-CM | POA: Diagnosis not present

## 2021-03-27 DIAGNOSIS — Z0000 Encounter for general adult medical examination without abnormal findings: Secondary | ICD-10-CM | POA: Diagnosis not present

## 2021-11-16 DIAGNOSIS — L570 Actinic keratosis: Secondary | ICD-10-CM | POA: Diagnosis not present

## 2021-11-16 DIAGNOSIS — D225 Melanocytic nevi of trunk: Secondary | ICD-10-CM | POA: Diagnosis not present

## 2021-11-16 DIAGNOSIS — L821 Other seborrheic keratosis: Secondary | ICD-10-CM | POA: Diagnosis not present

## 2021-11-16 DIAGNOSIS — L814 Other melanin hyperpigmentation: Secondary | ICD-10-CM | POA: Diagnosis not present

## 2021-11-16 DIAGNOSIS — L578 Other skin changes due to chronic exposure to nonionizing radiation: Secondary | ICD-10-CM | POA: Diagnosis not present

## 2022-04-09 DIAGNOSIS — Z0000 Encounter for general adult medical examination without abnormal findings: Secondary | ICD-10-CM | POA: Diagnosis not present

## 2022-04-09 DIAGNOSIS — E7800 Pure hypercholesterolemia, unspecified: Secondary | ICD-10-CM | POA: Diagnosis not present

## 2022-04-18 DIAGNOSIS — H66002 Acute suppurative otitis media without spontaneous rupture of ear drum, left ear: Secondary | ICD-10-CM | POA: Diagnosis not present

## 2022-04-26 DIAGNOSIS — E785 Hyperlipidemia, unspecified: Secondary | ICD-10-CM | POA: Diagnosis not present

## 2022-04-26 DIAGNOSIS — I252 Old myocardial infarction: Secondary | ICD-10-CM | POA: Diagnosis not present

## 2022-04-26 DIAGNOSIS — I25118 Atherosclerotic heart disease of native coronary artery with other forms of angina pectoris: Secondary | ICD-10-CM | POA: Diagnosis not present

## 2022-04-27 DIAGNOSIS — H60392 Other infective otitis externa, left ear: Secondary | ICD-10-CM | POA: Diagnosis not present

## 2022-11-30 DIAGNOSIS — L578 Other skin changes due to chronic exposure to nonionizing radiation: Secondary | ICD-10-CM | POA: Diagnosis not present

## 2022-11-30 DIAGNOSIS — L570 Actinic keratosis: Secondary | ICD-10-CM | POA: Diagnosis not present

## 2022-11-30 DIAGNOSIS — Z85828 Personal history of other malignant neoplasm of skin: Secondary | ICD-10-CM | POA: Diagnosis not present

## 2022-11-30 DIAGNOSIS — L814 Other melanin hyperpigmentation: Secondary | ICD-10-CM | POA: Diagnosis not present

## 2022-11-30 DIAGNOSIS — L918 Other hypertrophic disorders of the skin: Secondary | ICD-10-CM | POA: Diagnosis not present

## 2022-11-30 DIAGNOSIS — D225 Melanocytic nevi of trunk: Secondary | ICD-10-CM | POA: Diagnosis not present

## 2022-11-30 DIAGNOSIS — L821 Other seborrheic keratosis: Secondary | ICD-10-CM | POA: Diagnosis not present

## 2023-04-20 DIAGNOSIS — Z79899 Other long term (current) drug therapy: Secondary | ICD-10-CM | POA: Diagnosis not present

## 2023-04-20 DIAGNOSIS — I252 Old myocardial infarction: Secondary | ICD-10-CM | POA: Diagnosis not present

## 2023-04-20 DIAGNOSIS — Z7982 Long term (current) use of aspirin: Secondary | ICD-10-CM | POA: Diagnosis not present

## 2023-04-20 DIAGNOSIS — E785 Hyperlipidemia, unspecified: Secondary | ICD-10-CM | POA: Diagnosis not present

## 2023-04-20 DIAGNOSIS — I2510 Atherosclerotic heart disease of native coronary artery without angina pectoris: Secondary | ICD-10-CM | POA: Diagnosis not present

## 2023-04-20 DIAGNOSIS — Z955 Presence of coronary angioplasty implant and graft: Secondary | ICD-10-CM | POA: Diagnosis not present

## 2023-04-27 DIAGNOSIS — I2510 Atherosclerotic heart disease of native coronary artery without angina pectoris: Secondary | ICD-10-CM | POA: Diagnosis not present

## 2023-04-27 DIAGNOSIS — E7800 Pure hypercholesterolemia, unspecified: Secondary | ICD-10-CM | POA: Diagnosis not present

## 2023-04-27 DIAGNOSIS — Z0000 Encounter for general adult medical examination without abnormal findings: Secondary | ICD-10-CM | POA: Diagnosis not present

## 2023-05-05 DIAGNOSIS — M25561 Pain in right knee: Secondary | ICD-10-CM | POA: Diagnosis not present

## 2023-05-17 DIAGNOSIS — M1711 Unilateral primary osteoarthritis, right knee: Secondary | ICD-10-CM | POA: Diagnosis not present

## 2023-07-07 DIAGNOSIS — S39012A Strain of muscle, fascia and tendon of lower back, initial encounter: Secondary | ICD-10-CM | POA: Diagnosis not present

## 2023-07-07 DIAGNOSIS — R3915 Urgency of urination: Secondary | ICD-10-CM | POA: Diagnosis not present

## 2024-01-10 DIAGNOSIS — L814 Other melanin hyperpigmentation: Secondary | ICD-10-CM | POA: Diagnosis not present

## 2024-01-10 DIAGNOSIS — L821 Other seborrheic keratosis: Secondary | ICD-10-CM | POA: Diagnosis not present

## 2024-01-10 DIAGNOSIS — Z85828 Personal history of other malignant neoplasm of skin: Secondary | ICD-10-CM | POA: Diagnosis not present

## 2024-01-10 DIAGNOSIS — L578 Other skin changes due to chronic exposure to nonionizing radiation: Secondary | ICD-10-CM | POA: Diagnosis not present

## 2024-01-10 DIAGNOSIS — L570 Actinic keratosis: Secondary | ICD-10-CM | POA: Diagnosis not present

## 2024-01-10 DIAGNOSIS — D225 Melanocytic nevi of trunk: Secondary | ICD-10-CM | POA: Diagnosis not present
